# Patient Record
Sex: Male | Born: 1965 | Race: White | Hispanic: No | Marital: Married | Smoking: Never smoker
Health system: Southern US, Community
[De-identification: ages and names within clinical notes are randomized; demographics above are authoritative.]

## PROBLEM LIST (undated history)

## (undated) DIAGNOSIS — I10 Essential (primary) hypertension: Secondary | ICD-10-CM

---

## 2016-05-13 ENCOUNTER — Encounter (HOSPITAL_COMMUNITY): Payer: Self-pay | Admitting: *Deleted

## 2016-05-13 ENCOUNTER — Emergency Department (HOSPITAL_COMMUNITY): Payer: BLUE CROSS/BLUE SHIELD

## 2016-05-13 ENCOUNTER — Emergency Department (HOSPITAL_COMMUNITY)
Admission: EM | Admit: 2016-05-13 | Discharge: 2016-05-14 | Disposition: A | Discharge status: Home / Self Care | Payer: BLUE CROSS/BLUE SHIELD | Attending: Emergency Medicine | Admitting: Emergency Medicine

## 2016-05-13 ENCOUNTER — Encounter (HOSPITAL_COMMUNITY): Payer: Self-pay | Admitting: Emergency Medicine

## 2016-05-13 ENCOUNTER — Ambulatory Visit (HOSPITAL_COMMUNITY)
Admission: EM | Admit: 2016-05-13 | Discharge: 2016-05-13 | Disposition: A | Discharge status: Home / Self Care | Payer: BLUE CROSS/BLUE SHIELD

## 2016-05-13 DIAGNOSIS — I10 Essential (primary) hypertension: Secondary | ICD-10-CM | POA: Diagnosis not present

## 2016-05-13 DIAGNOSIS — R198 Other specified symptoms and signs involving the digestive system and abdomen: Secondary | ICD-10-CM

## 2016-05-13 DIAGNOSIS — R740 Nonspecific elevation of levels of transaminase and lactic acid dehydrogenase [LDH]: Secondary | ICD-10-CM | POA: Diagnosis not present

## 2016-05-13 DIAGNOSIS — R7401 Elevation of levels of liver transaminase levels: Secondary | ICD-10-CM

## 2016-05-13 DIAGNOSIS — R945 Abnormal results of liver function studies: Secondary | ICD-10-CM | POA: Diagnosis not present

## 2016-05-13 DIAGNOSIS — R799 Abnormal finding of blood chemistry, unspecified: Secondary | ICD-10-CM | POA: Diagnosis present

## 2016-05-13 DIAGNOSIS — R7989 Other specified abnormal findings of blood chemistry: Secondary | ICD-10-CM

## 2016-05-13 HISTORY — DX: Essential (primary) hypertension: I10

## 2016-05-13 LAB — CBC
HEMATOCRIT: 46 % (ref 39.0–52.0)
Hemoglobin: 16 g/dL (ref 13.0–17.0)
MCH: 30.9 pg (ref 26.0–34.0)
MCHC: 34.8 g/dL (ref 30.0–36.0)
MCV: 88.8 fL (ref 78.0–100.0)
PLATELETS: 223 10*3/uL (ref 150–400)
RBC: 5.18 MIL/uL (ref 4.22–5.81)
RDW: 13.1 % (ref 11.5–15.5)
WBC: 13 10*3/uL — AB (ref 4.0–10.5)

## 2016-05-13 LAB — URINALYSIS, ROUTINE W REFLEX MICROSCOPIC
Bacteria, UA: NONE SEEN
Bilirubin Urine: NEGATIVE
GLUCOSE, UA: NEGATIVE mg/dL
KETONES UR: NEGATIVE mg/dL
LEUKOCYTES UA: NEGATIVE
Nitrite: NEGATIVE
PROTEIN: NEGATIVE mg/dL
RBC / HPF: NONE SEEN RBC/hpf (ref 0–5)
SQUAMOUS EPITHELIAL / LPF: NONE SEEN
Specific Gravity, Urine: 1.001 — ABNORMAL LOW (ref 1.005–1.030)
WBC UA: NONE SEEN WBC/hpf (ref 0–5)
pH: 6 (ref 5.0–8.0)

## 2016-05-13 LAB — COMPREHENSIVE METABOLIC PANEL
ALT: 552 U/L — AB (ref 17–63)
ANION GAP: 11 (ref 5–15)
AST: 220 U/L — ABNORMAL HIGH (ref 15–41)
Albumin: 3.6 g/dL (ref 3.5–5.0)
Alkaline Phosphatase: 163 U/L — ABNORMAL HIGH (ref 38–126)
BUN: 11 mg/dL (ref 6–20)
CHLORIDE: 98 mmol/L — AB (ref 101–111)
CO2: 20 mmol/L — AB (ref 22–32)
CREATININE: 1.07 mg/dL (ref 0.61–1.24)
Calcium: 9.1 mg/dL (ref 8.9–10.3)
Glucose, Bld: 93 mg/dL (ref 65–99)
POTASSIUM: 3.5 mmol/L (ref 3.5–5.1)
SODIUM: 129 mmol/L — AB (ref 135–145)
Total Bilirubin: 3 mg/dL — ABNORMAL HIGH (ref 0.3–1.2)
Total Protein: 7.2 g/dL (ref 6.5–8.1)

## 2016-05-13 LAB — LIPASE, BLOOD: LIPASE: 27 U/L (ref 11–51)

## 2016-05-13 MED ORDER — SODIUM CHLORIDE 0.9 % IV BOLUS (SEPSIS)
1000.0000 mL | Freq: Once | INTRAVENOUS | Status: AC
  Administered 2016-05-13: 1000 mL via INTRAVENOUS

## 2016-05-13 MED ORDER — IOPAMIDOL (ISOVUE-300) INJECTION 61%
INTRAVENOUS | Status: AC
  Administered 2016-05-13: 100 mL
  Filled 2016-05-13: qty 100

## 2016-05-13 NOTE — ED Provider Notes (Signed)
[facility]    CSN: [id number] Arrival date & time: 05/13/16  1126     History   Chief Complaint Chief Complaint  Patient presents with  . Hypertension  . Labs Only    HPI Carson Joel is a 51 y.o. male.   The history is provided by the patient and the spouse.  Hypertension  This is a new problem. Associated symptoms include headaches. Associated symptoms comments: Pt with complicated hx moving to different doctor with abnl lfts and gi sx, ha and hbp for sev days. Needs new lmd.and further w/up.Joel Carson    Past Medical History:  Diagnosis Date  . Hypertension     There are no active problems to display for this patient.   History reviewed. No pertinent surgical history.     Home Medications    Prior to Admission medications   Medication Sig Start Date End Date Taking? Authorizing Provider  lisinopril (PRINIVIL,ZESTRIL) 10 MG tablet Take 10 mg by mouth daily.   Yes Historical Provider, MD    Family History History reviewed. No pertinent family history.  Social History Social History  Substance Use Topics  . Smoking status: Not on file  . Smokeless tobacco: Never Used  . Alcohol use Yes     Comment: occ     Allergies   Amoxicillin   Review of Systems Review of Systems  Neurological: Positive for headaches.  All other systems reviewed and are negative.    Physical Exam Triage Vital Signs ED Triage Vitals [05/13/16 1232]  Enc Vitals Group     BP 139/94     Pulse Rate 98     Resp 18     Temp 98.1 F (36.7 C)     Temp Source Oral     SpO2 100 %     Weight      Height      Head Circumference      Peak Flow      Pain Score      Pain Loc      Pain Edu?      Excl. in GC?    No data found.   Updated Vital Signs BP 139/94 (BP Location: Left Arm)   Pulse 98   Temp 98.1 F (36.7 C) (Oral)   Resp 18   SpO2 100%   Visual Acuity Right Eye Distance:   Left Eye Distance:   Bilateral Distance:    Right Eye Near:   Left Eye  Near:    Bilateral Near:     Physical Exam  Constitutional: He is oriented to person, place, and time. He appears well-developed and well-nourished. He appears distressed.  Eyes: Conjunctivae are normal. Pupils are equal, round, and reactive to light.  Neck: Normal range of motion. Neck supple.  Neurological: He is alert and oriented to person, place, and time.  Skin: Skin is warm and dry.     UC Treatments / Results  Labs (all labs ordered are listed, but only abnormal results are displayed) Labs Reviewed - No data to display  EKG  EKG Interpretation None       Radiology No results found.  Procedures Procedures (including critical care time)  Medications Ordered in UC Medications - No data to display   Initial Impression / Assessment and Plan / UC Course  I have reviewed the triage vital signs and the nursing notes.  Pertinent labs & imaging results that were available during my care of the patient were reviewed by me  and considered in my medical decision making (see chart for details).     Sent for gi eval of elevated lft;s and sx.  Final Clinical Impressions(s) / UC Diagnoses   Final diagnoses:  GI problem  Essential hypertension    New Prescriptions New Prescriptions   No medications on file     Joel HoffJames D Kindl, MD 05/13/16 1334

## 2016-05-13 NOTE — Discharge Instructions (Signed)
Go to ER for further eval

## 2016-05-13 NOTE — ED Triage Notes (Signed)
Pt here for abd pain and bloating x several weeks and increased liver enzyme levels; pt here for follow up ;pt sts started on protonix but sts made his BP increase

## 2016-05-13 NOTE — ED Provider Notes (Signed)
[facility] Note   CSN: [id number] Arrival date & time: 05/13/16  1344     History   Chief Complaint Chief Complaint  Patient presents with  . Abnormal Lab    HPI Joel Carson is a 51 y.o. male.  HPI Joel Carson is a 51 y.o. male with PMH significant for HTN who presents with Intermittent epigastric bloating since August, 5 months. He notes episodes typically last about a day or 2 and resolve. He states this particular episode began 4 days ago. He reports he has not been able to eat due to the discomfort x 4 days aside from a small bag of goldfish. He was prescribed a PPI, but states that it did not seem to help very much and interfered with his blood pressure medication. He has also taken Gas-X, Pepto-Bismol, and Tums with some relief. No fever, chills, nausea, vomiting, diarrhea, constipation, chest pain, shortness of breath. No prior abdominal surgeries. Initial episode that occurred in August he attributed to food poisoning. He was seen at [facility] 2 days ago where they were able to perform labs. His T bili was 2.9, AST 480, ALT 394, and alkaline phosphatase 92. He states he was told to go to the ED, but waited to see if his symptoms would resolve. They did not, so he is here today. He denies any heavy Tylenol or NSAID use. He drinks about one to 2 beers a week. Wife notes he does travel a lot for work, and went to [place] about 3 months ago.  Past Medical History:  Diagnosis Date  . Hypertension     There are no active problems to display for this patient.   History reviewed. No pertinent surgical history.     Home Medications    Prior to Admission medications   Medication Sig Start Date End Date Taking? Authorizing Provider  lisinopril (PRINIVIL,ZESTRIL) 10 MG tablet Take 10 mg by mouth daily.    Historical Provider, MD    Family History History reviewed. No pertinent family history.  Social History Social History  Substance Use  Topics  . Smoking status: Not on file  . Smokeless tobacco: Never Used  . Alcohol use Yes     Comment: occ     Allergies   Amoxicillin   Review of Systems Review of Systems All other systems negative unless otherwise stated in HPI   Physical Exam Updated Vital Signs BP 124/75 (BP Location: Left Arm)   Pulse 82   Temp 98.8 F (37.1 C) (Oral)   Resp 16   SpO2 99%   Physical Exam  Constitutional: He is oriented to person, place, and time. He appears well-developed and well-nourished.  Non-toxic appearance. He does not have a sickly appearance. He does not appear ill.  HENT:  Head: Normocephalic and atraumatic.  Mouth/Throat: Oropharynx is clear and moist.  Eyes: Conjunctivae are normal.  Neck: Normal range of motion. Neck supple.  Cardiovascular: Normal rate and regular rhythm.   Pulmonary/Chest: Effort normal and breath sounds normal. No accessory muscle usage or stridor. No respiratory distress. He has no wheezes. He has no rhonchi. He has no rales.  Abdominal: Soft. Bowel sounds are normal. He exhibits no distension. There is no tenderness. There is no rebound and no guarding.  Musculoskeletal: Normal range of motion.  Lymphadenopathy:    He has no cervical adenopathy.  Neurological: He is alert and oriented to person, place, and time.  Speech clear without dysarthria.  Skin: Skin is  warm and dry.  Psychiatric: He has a normal mood and affect. His behavior is normal.     ED Treatments / Results  Labs (all labs ordered are listed, but only abnormal results are displayed) Labs Reviewed  COMPREHENSIVE METABOLIC PANEL - Abnormal; Notable for the following:       Result Value   Sodium 129 (*)    Chloride 98 (*)    CO2 20 (*)    AST 220 (*)    ALT 552 (*)    Alkaline Phosphatase 163 (*)    Total Bilirubin 3.0 (*)    All other components within normal limits  CBC - Abnormal; Notable for the following:    WBC 13.0 (*)    All other components within normal limits    LIPASE, BLOOD  URINALYSIS, ROUTINE W REFLEX MICROSCOPIC  HEPATITIS PANEL, ACUTE    EKG  EKG Interpretation None       Radiology No results found.  Procedures Procedures (including critical care time)  Medications Ordered in ED Medications  sodium chloride 0.9 % bolus 1,000 mL (not administered)     Initial Impression / Assessment and Plan / ED Course  I have reviewed the triage vital signs and the nursing notes.  Pertinent labs & imaging results that were available during my care of the patient were reviewed by me and considered in my medical decision making (see chart for details).     Patient presents with intermittent epigastric abdominal discomfort and bloating for the last 5 months. Current episode started about 4 days ago and isn't relieved with Tums, Pepto-Bismol, or Gas-X. No fever, chills, nausea, vomiting, diarrhea. On exam, patient appears well, nontoxic or ill. Abdomen is soft and benign. Labs remarkable for elevated transaminases, alkaline phosphatase, and total bili. These appear to be elevating when compared to results from 2 days ago. Hepatitis panel pending. UA pending.  Concern for cholelithiasis, choledocholithiasis, structural abnormalities of the liver/pancreas.  Low suspicion for cholecystitis. Plan to obtain RUQ for further evaluation. Care hand off to oncoming provider who will follow up on imaging and appropriate dispo.  Final Clinical Impressions(s) / ED Diagnoses   Final diagnoses:  Elevated transaminase level    New Prescriptions New Prescriptions   No medications on file     Cheri Fowler, Cordelia Poche 05/13/16 2009    Maia Plan, MD 05/14/16 [phone]

## 2016-05-13 NOTE — ED Provider Notes (Signed)
Patient signed out at end of shift by Cheri FowlerKayla Rose, PA-C. Patient has been having intermittent epigastric pain and bloating x 5 months, this episode X 4days. He reports minimal nausea, with one episode vomiting that occurred yesterday. He states the bloating prevents him from eating. He was seen at at an Urgent Care 3 days ago and contacted regarding abnormal lab studies, specifically, abnormally elevated LFT's, and urged to come to the emergency department for further evaluation. Here, LFT's are again elevated with total bilirubin of 3.0, AST of 220 (down from urgent care visit), ALT 552 (increased from urgent care visit). There is a pending US at time of sign out. Patient has not required pain medications.  9:15 - US negative, although, gall bladder is poorly visualized. No sonographic Murphy's sign. Re-evaluation of the patient finds him comfortable, in NAD, abdomen remains completely nontender. Discussed CT for better evaluation of abnormalities. Patient and wife agree. CT ordered.    12:45 - CT results show no biliary ductal dilatation. No definite cholecystitis or cholangitis. Patient remains completely nontender. VSS, afebrile. Hepatitis panel pending. Results reviewed with Dr. Preston FleetingGlick. Will discharge patient home with GI referral for further outpatient evaluation.    Elpidio AnisShari Alejo Beamer, PA-C 05/14/16 [phone]    Maia PlanJoshua G Long, MD 05/14/16 [phone]

## 2016-05-13 NOTE — ED Notes (Signed)
Pt to us via stretcher

## 2016-05-13 NOTE — ED Notes (Addendum)
Explained to pt need for urine sample. Pt reports he went to the restroom in the waiting room but verbalized understanding and will attempt to void "in a little while"

## 2016-05-13 NOTE — ED Triage Notes (Signed)
Patient reports intermittent abdominal pain and bloating x several months. States was seen at [facility] and was told the AST/ALTs were elevated, was told would need follow up testing. Patient states he is given protonix and was taking until today as it increased blood pressure, states doubled up on BP medications.

## 2016-05-14 LAB — HEPATITIS PANEL, ACUTE
HEP B S AG: NEGATIVE
Hep A IgM: NEGATIVE
Hep B C IgM: NEGATIVE

## 2017-07-05 IMAGING — CT CT ABD-PELV W/ CM
2 of 5 series · 16 of 46 positions shown, 18 images · IV contrast (APPLIED)
Comparison: Prior ultrasound from earlier the same day.

CLINICAL DATA: Initial evaluation for acute abdominal pain and
bloating for several weeks, elevated LFTs.

EXAM:
CT ABDOMEN AND PELVIS WITH CONTRAST
TECHNIQUE: Multidetector CT imaging of the abdomen and pelvis was performed
using the standard protocol following bolus administration of
intravenous contrast.
CONTRAST:  100mL [id number] IOPAMIDOL ([id number]) INJECTION 61%

[Series 2: abd/ pelvis 5.0 i30f 1 · axial · 0.84mm/px · z∈[+637,+1057]mm · 13 of 94 slices shown, 15 images]
[im 5/94  soft-tissue]
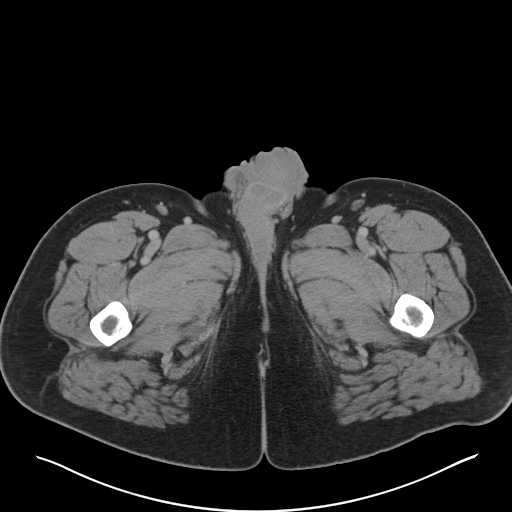
[im 5/94  bone]
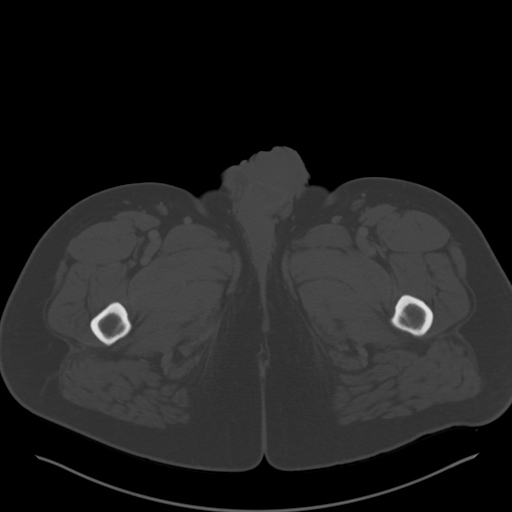
[im 14/94  soft-tissue]
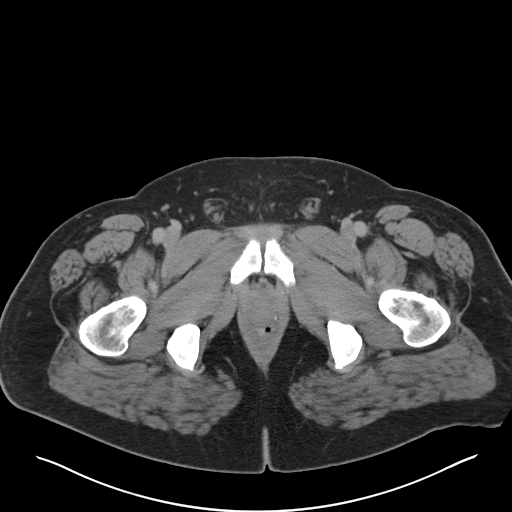
[im 19/94  soft-tissue]
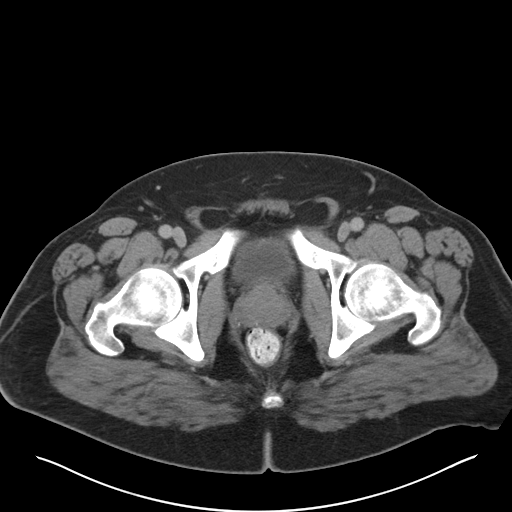
[im 28/94  soft-tissue]
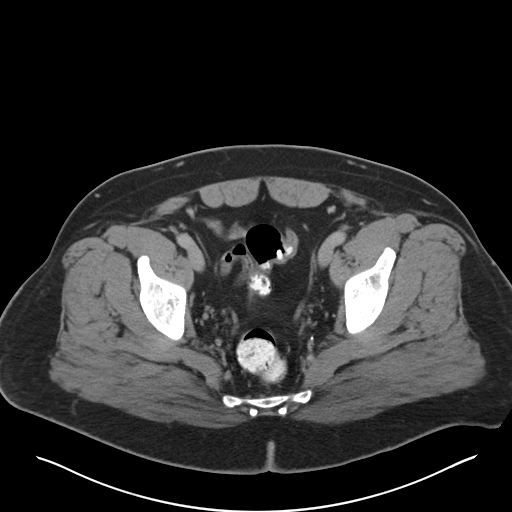
[im 33/94  soft-tissue]
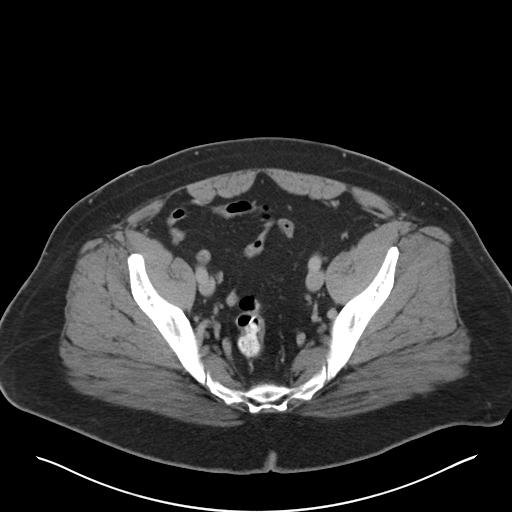
[im 42/94  soft-tissue]
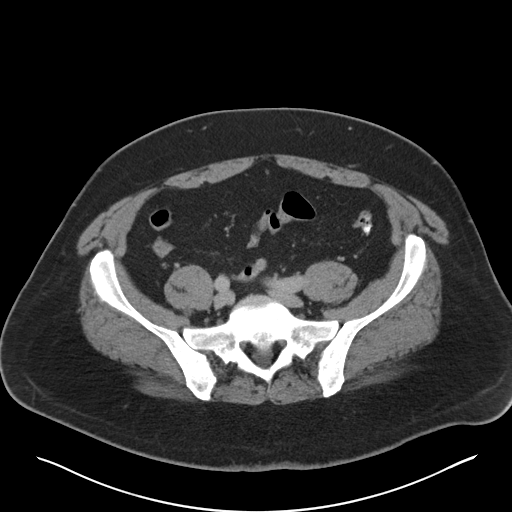
[im 47/94  soft-tissue]
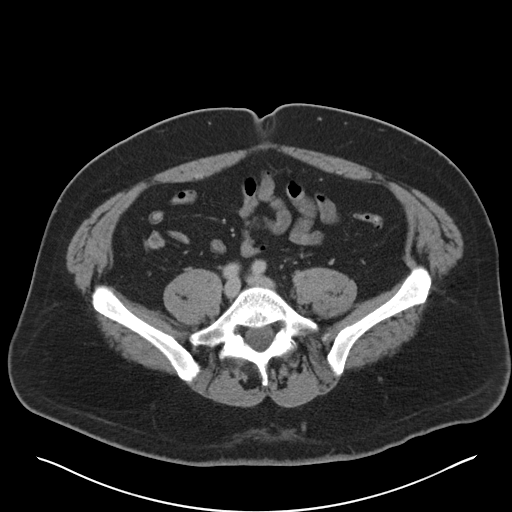
[im 52/94  soft-tissue]
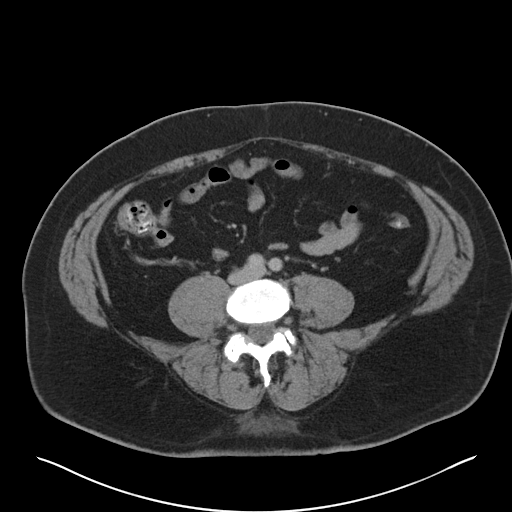
[im 61/94  soft-tissue]
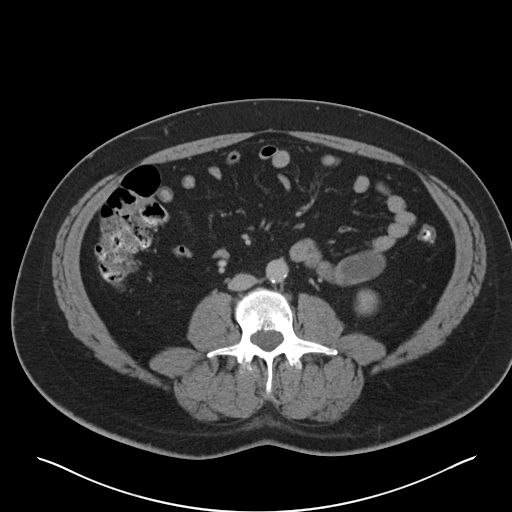
[im 61/94  bone]
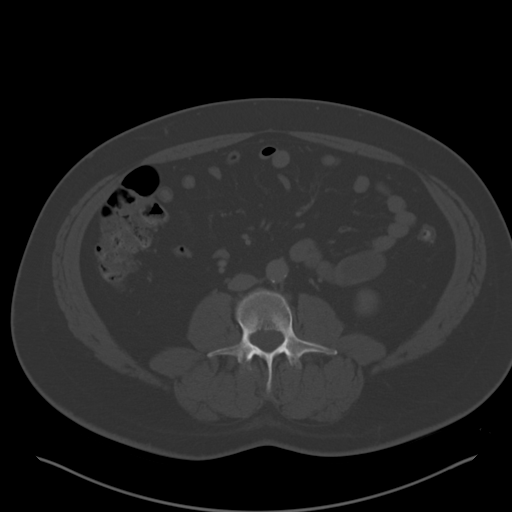
[im 66/94  soft-tissue]
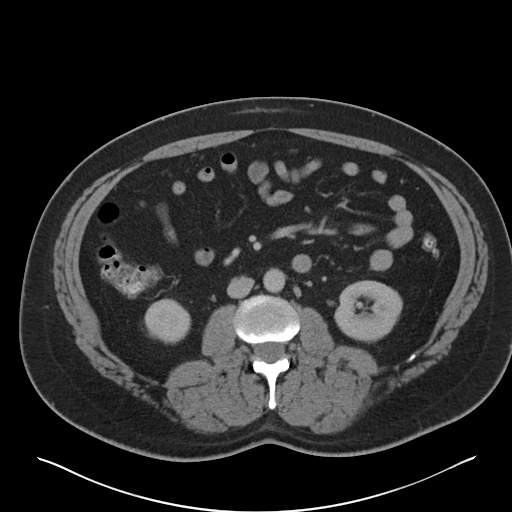
[im 75/94  soft-tissue]
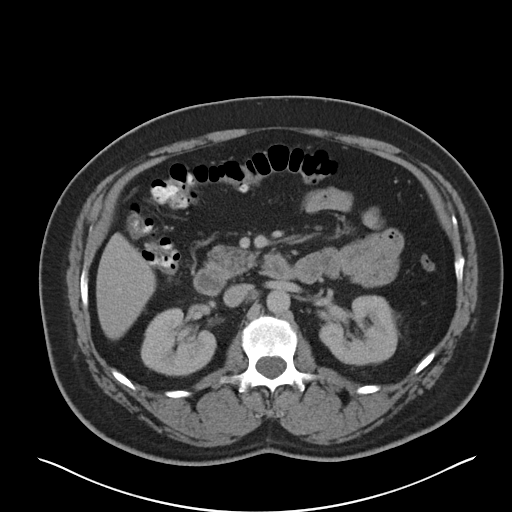
[im 80/94  soft-tissue]
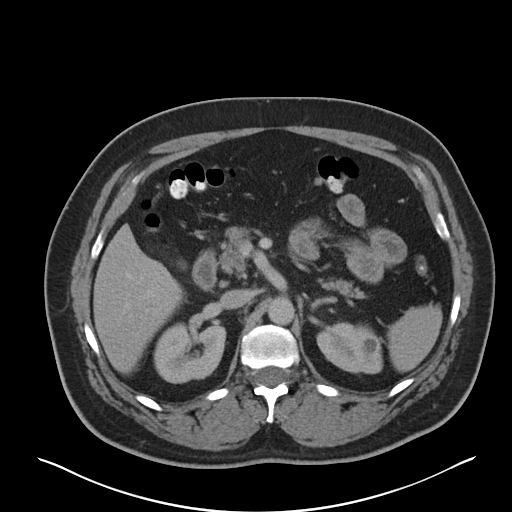
[im 89/94  soft-tissue]
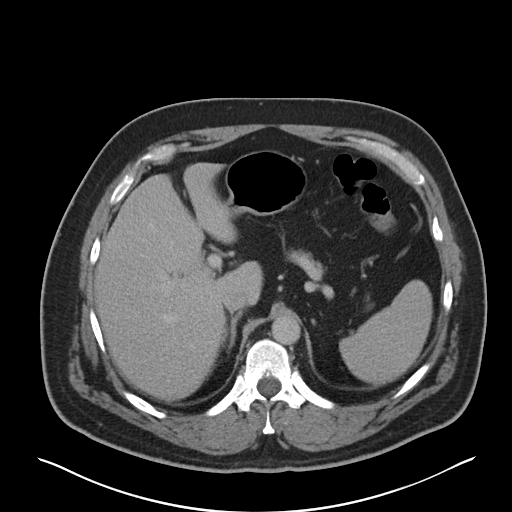

[Series 5: coronal soft tissue · coronal · 0.86mm/px · 3 of 99 slices shown]
[im 33/99  soft-tissue]
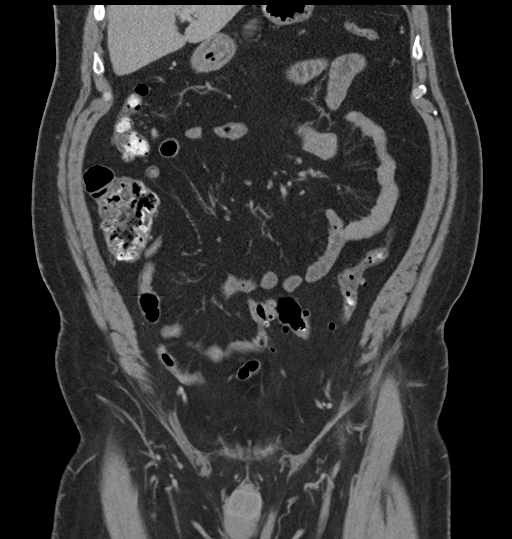
[im 44/99  soft-tissue]
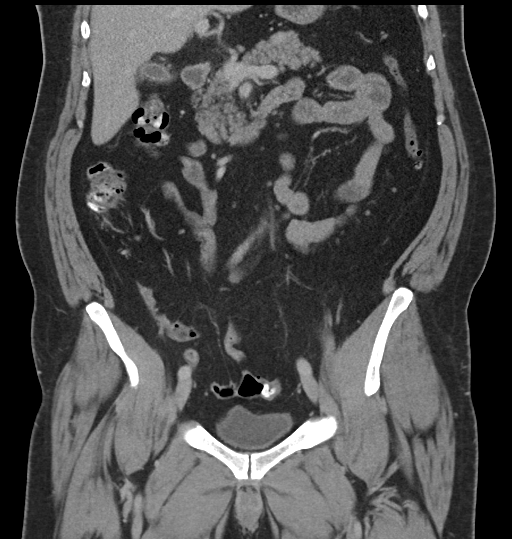
[im 55/99  soft-tissue]
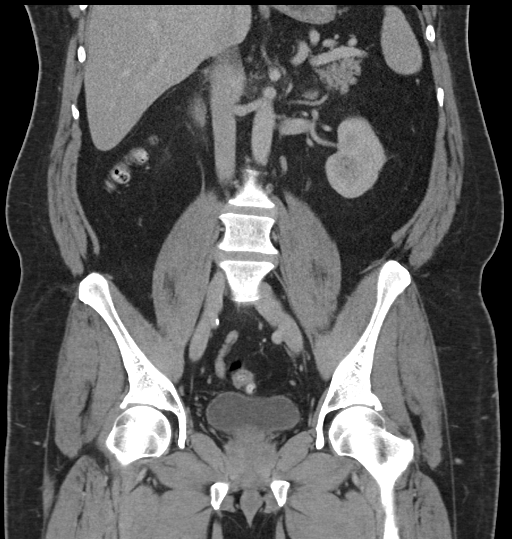

[16 of 46 positions shown; findings below may reference images not displayed]

FINDINGS: Lower chest: Scattered atelectatic changes present within the
visualized lung bases.

Hepatobiliary: Visualized liver demonstrates a normal contrast
enhanced appearance. Gallbladder is contracted. There is associated
mucosal enhancement with hazy inflammatory stranding about the
gallbladder itself, suspicious for possible acute cholecystitis.
Hazy inflammatory stranding also noted about the common bile duct
which is not dilated. No definite radiopaque calculi seen within the
contracted gallbladder.

Pancreas: Pancreas within normal limits. No pancreatic ductal
dilatation.

Spleen: Spleen within normal limits.

Adrenals/Urinary Tract: Adrenal glands are normal. Kidneys equal in
size with symmetric enhancement. Subcentimeter hypodensity within
the left kidney too small the characterize, but statistically likely
reflects a small cyst. No nephrolithiasis, hydronephrosis, or focal
enhancing renal mass. No hydroureter. Partially distended bladder
within normal limits.

Stomach/Bowel: Visualized stomach within normal limits. No evidence
for bowel obstruction. No findings to suggest acute appendicitis.
Colonic diverticulosis without evidence for acute diverticulitis. No
acute inflammatory changes seen about the bowels.

Vascular/Lymphatic: Normal intravascular enhancement seen throughout
the intra-abdominal aorta and its branch vessels. No pathologically
enlarged intra-abdominal or pelvic lymph nodes.

Reproductive: Prostate within normal limits.

Other: No free air or fluid. Fat containing paraumbilical hernia
noted. Small bilateral fat containing inguinal hernias noted as
well.

Musculoskeletal: No acute osseous abnormality. No worrisome lytic or
blastic osseous lesions.
IMPRESSION: 1. Hazy inflammatory stranding with mucosal enhancement about the
contracted gallbladder and non dilated common bile duct. Findings
raise the possibility for acute cholecystitis or possibly
cholangitis. Correlation with symptomatology and laboratory values
recommended. No radiopaque gallstones identified.
2. No other acute intra-abdominal or pelvic process identified.
3. Colonic diverticulosis without evidence for acute diverticulitis.

## 2023-04-27 ENCOUNTER — Encounter: Payer: Self-pay | Admitting: Emergency Medicine

## 2023-04-27 ENCOUNTER — Ambulatory Visit
Admission: EM | Admit: 2023-04-27 | Discharge: 2023-04-27 | Disposition: A | Discharge status: Home / Self Care | Payer: BC Managed Care – PPO | Attending: Family Medicine | Admitting: Family Medicine

## 2023-04-27 DIAGNOSIS — J0191 Acute recurrent sinusitis, unspecified: Secondary | ICD-10-CM | POA: Diagnosis not present

## 2023-04-27 MED ORDER — AZITHROMYCIN 250 MG PO TABS: 250.0000 mg | ORAL_TABLET | Freq: Every day | ORAL | 0 refills | Status: AC

## 2023-04-27 NOTE — Discharge Instructions (Signed)
 Consider adding Flonase Drink lots of water Take the Z-Pak as directed See your doctor if not improving by next week

## 2023-04-27 NOTE — ED Triage Notes (Signed)
 Patient c/o possible sinus infection, cough at night, drainage down throat x 3-4 days.  Patient has been taken Delysm and Zyrtec-D.  States that his mucous has turned green in color.

## 2023-04-27 NOTE — ED Provider Notes (Signed)
 [facility]    CSN: [id number] Arrival date & time: 04/27/23  1212      History   Chief Complaint Chief Complaint  Patient presents with   Sinus Infection    HPI Joel Carson is a 58 y.o. male.   HPI  Patient states he has allergies.  Frequent sinus infections.  He states that he has some runny stuffy nose, postnasal drip, sinus pressure and pain, cough.  He states his symptoms are getting worse over time.  He states he feels he has a sinus infection that we will need antibiotics.  He states that his sputum has turned green in color and there are blood streaks.  Past Medical History:  Diagnosis Date   Hypertension     There are no active problems to display for this patient.   History reviewed. No pertinent surgical history.     Home Medications    Prior to Admission medications   Medication Sig Start Date End Date Taking? Authorizing Provider  ALPRAZolam (XANAX XR) 0.5 MG 24 hr tablet Take 0.5 mg by mouth daily. 04/04/23 10/01/23 Yes [provider]  azithromycin  (ZITHROMAX ) 250 MG tablet Take 1 tablet (250 mg total) by mouth daily. Take first 2 tablets together, then 1 every day until finished. 04/27/23  Yes Maranda Jamee Jacob, MD  lisdexamfetamine (VYVANSE) 50 MG capsule Take 50 mg by mouth daily. 03/17/23  Yes [provider]  lisinopril (PRINIVIL,ZESTRIL) 10 MG tablet Take 10 mg by mouth daily.   Yes [provider]  WEGOVY 2.4 MG/0.75ML SOAJ Inject 2.4 mg into the skin. 03/17/23  Yes [provider]  ZYRTEC-D ALLERGY & CONGESTION 5-120 MG tablet Take 1 tablet by mouth every 12 (twelve) hours. 06/12/14  Yes [provider]    Family History Family History  Problem Relation Age of Onset   Healthy Mother     Social History Social History   Tobacco Use   Smoking status: Never   Smokeless tobacco: Never  Vaping Use   Vaping status: Never Used  Substance Use Topics   Alcohol use: Yes    Comment: occ    Drug use: Never     Allergies   Amoxicillin   Review of Systems Review of Systems See HPI  Physical Exam Triage Vital Signs ED Triage Vitals  Encounter Vitals Group     BP 04/27/23 1246 (!) 131/90     Systolic BP Percentile --      Diastolic BP Percentile --      Pulse Rate 04/27/23 1246 90     Resp 04/27/23 1246 18     Temp 04/27/23 1246 98.6 F (37 C)     Temp Source 04/27/23 1246 Oral     SpO2 04/27/23 1246 96 %     Weight 04/27/23 1247 235 lb (106.6 kg)     Height 04/27/23 1247 5' 11 (1.803 m)     Head Circumference --      Peak Flow --      Pain Score 04/27/23 1247 0     Pain Loc --      Pain Education --      Exclude from Growth Chart --    No data found.  Updated Vital Signs BP (!) 131/90 (BP Location: Left Arm)   Pulse 90   Temp 98.6 F (37 C) (Oral)   Resp 18   Ht 5' 11 (1.803 m)   Wt 106.6 kg   SpO2 96%   BMI 32.78  kg/m      Physical Exam Constitutional:      General: He is not in acute distress.    Appearance: He is well-developed.  HENT:     Head: Normocephalic and atraumatic.     Right Ear: Tympanic membrane normal.     Left Ear: Tympanic membrane normal.     Nose: Congestion present.     Mouth/Throat:     Pharynx: Posterior oropharyngeal erythema present.  Eyes:     Conjunctiva/sclera: Conjunctivae normal.     Pupils: Pupils are equal, round, and reactive to light.  Cardiovascular:     Rate and Rhythm: Normal rate and regular rhythm.     Heart sounds: Normal heart sounds.  Pulmonary:     Effort: Pulmonary effort is normal. No respiratory distress.     Breath sounds: Rhonchi present.  Abdominal:     General: There is no distension.     Palpations: Abdomen is soft.  Musculoskeletal:        General: Normal range of motion.     Cervical back: Normal range of motion.  Lymphadenopathy:     Cervical: Cervical adenopathy present.  Skin:    General: Skin is warm and dry.  Neurological:     Mental Status: He is alert.      UC  Treatments / Results  Labs (all labs ordered are listed, but only abnormal results are displayed) Labs Reviewed - No data to display  EKG   Radiology No results found.  Procedures Procedures (including critical care time)  Medications Ordered in UC Medications - No data to display  Initial Impression / Assessment and Plan / UC Course  I have reviewed the triage vital signs and the nursing notes.  Pertinent labs & imaging results that were available during my care of the patient were reviewed by me and considered in my medical decision making (see chart for details).     Reviewed with patient that his sinus infection may likely be a virus.  That most sinus infections are.  That antibiotics are indicated if the symptoms last longer than 10 days.  Patient feels that he knows when he needs antibiotics, and that he has seen signs of bacterial infection.  A Z-Pak is provided of his request Final Clinical Impressions(s) / UC Diagnoses   Final diagnoses:  Acute recurrent sinusitis, unspecified location     Discharge Instructions      Consider adding Flonase Drink lots of water Take the Z-Pak as directed See your doctor if not improving by next week   ED Prescriptions     Medication Sig Dispense Auth. Provider   azithromycin  (ZITHROMAX ) 250 MG tablet Take 1 tablet (250 mg total) by mouth daily. Take first 2 tablets together, then 1 every day until finished. 6 tablet Maranda Jamee Jacob, MD      PDMP not reviewed this encounter.   Maranda Jamee Jacob, MD 04/27/23 [phone]
